# Patient Record
Sex: Female | Born: 2000 | Race: Black or African American | Hispanic: No | Marital: Single | Smoking: Never smoker
Health system: Southern US, Community
[De-identification: ages and names within clinical notes are randomized; demographics above are authoritative.]

## PROBLEM LIST (undated history)

## (undated) DIAGNOSIS — F32A Depression, unspecified: Secondary | ICD-10-CM

## (undated) DIAGNOSIS — T1491XA Suicide attempt, initial encounter: Secondary | ICD-10-CM

## (undated) DIAGNOSIS — F419 Anxiety disorder, unspecified: Secondary | ICD-10-CM

## (undated) DIAGNOSIS — F319 Bipolar disorder, unspecified: Secondary | ICD-10-CM

## (undated) DIAGNOSIS — F209 Schizophrenia, unspecified: Secondary | ICD-10-CM

---
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
## 2023-01-24 IMAGING — DX DG CHEST 1V PORT
1 series · 1 of 1 positions shown · non-contrast
Comparison: None.

CLINICAL DATA: Altered mental status, drug overdose.

EXAM:
PORTABLE CHEST 1 VIEW

[chest]
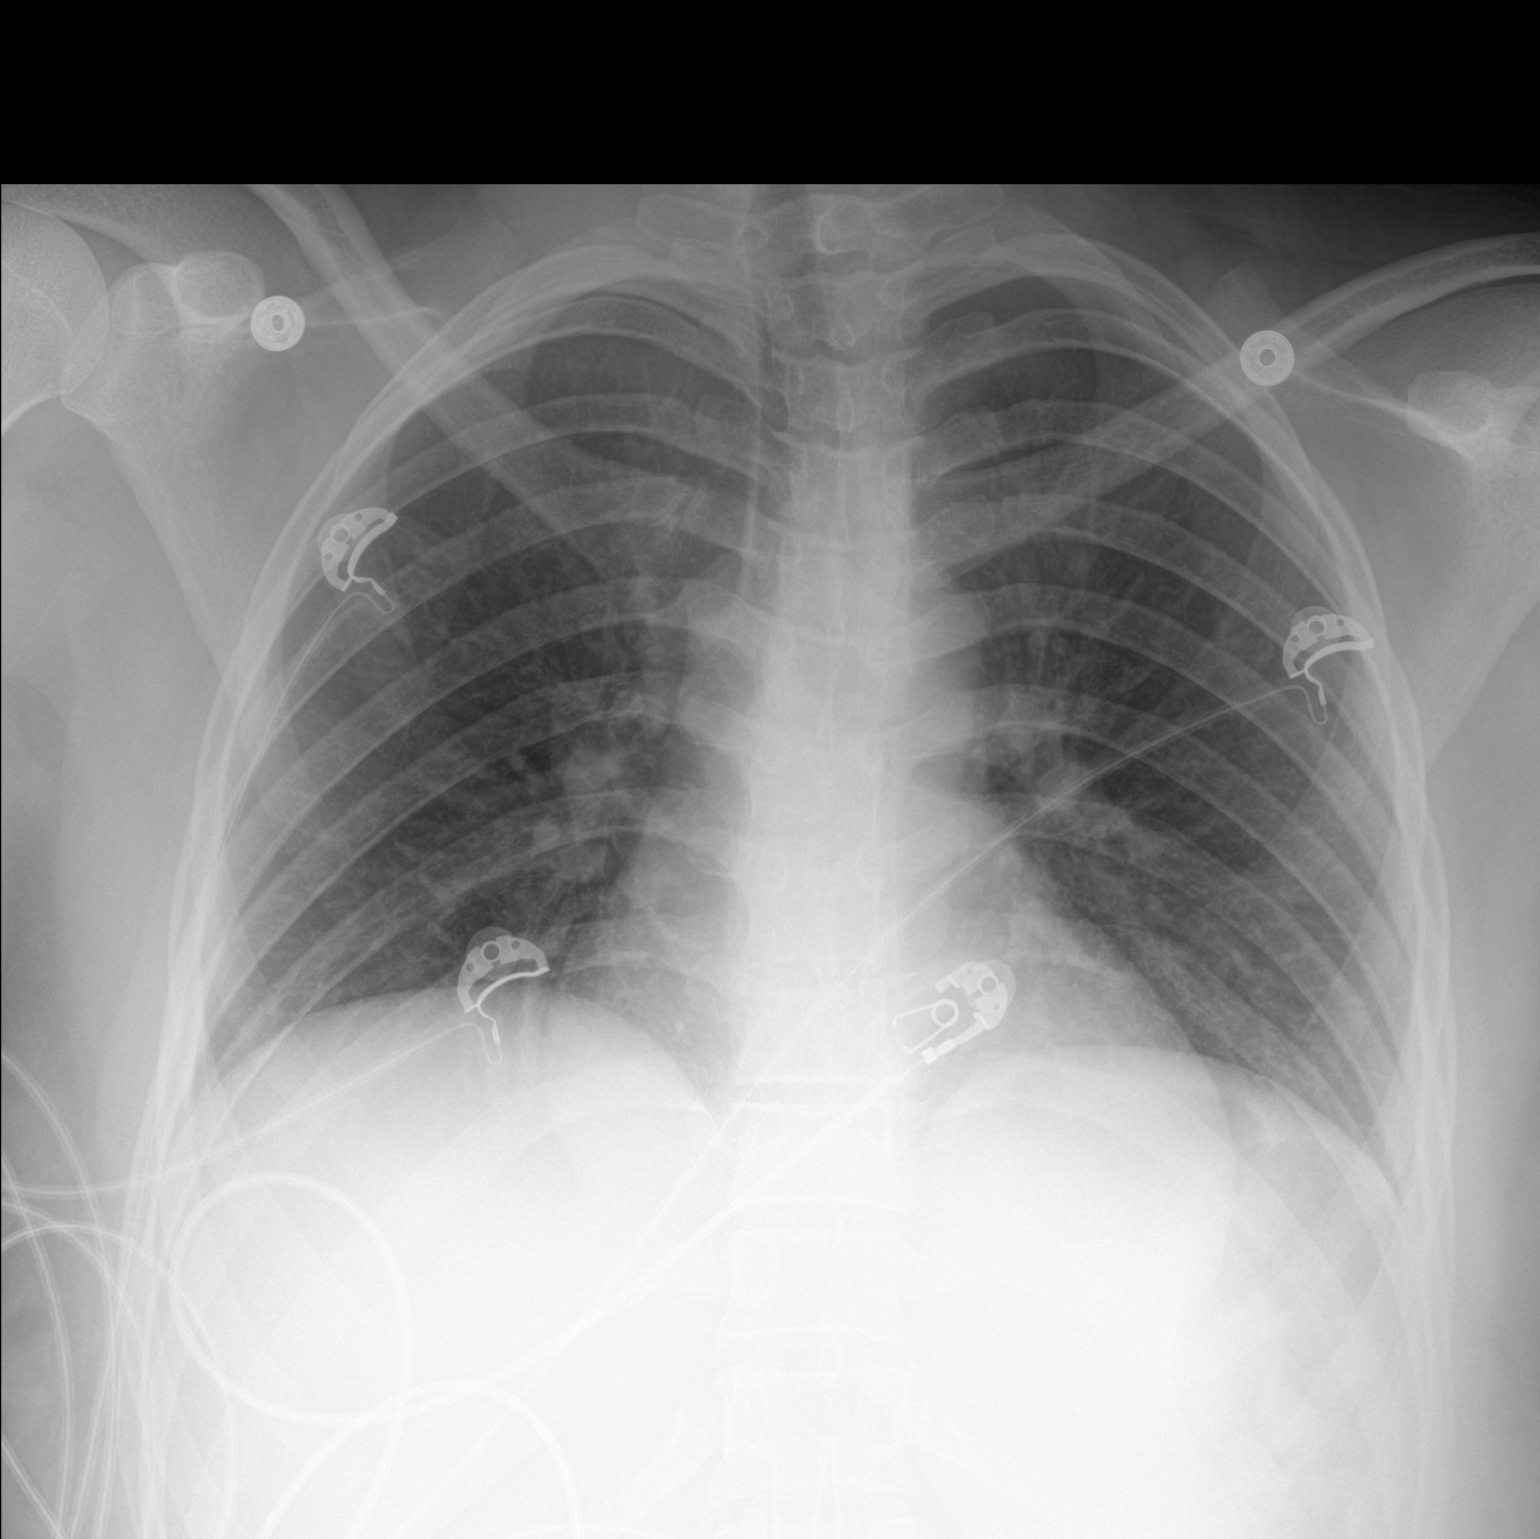

[1 of 1 positions shown; findings below may reference images not displayed]

FINDINGS: The heart size and mediastinal contours are within normal limits.
Both lungs are clear. The visualized skeletal structures are
unremarkable.
IMPRESSION: No active disease.

## 2023-08-05 ENCOUNTER — Ambulatory Visit
Admission: EM | Admit: 2023-08-05 | Discharge: 2023-08-05 | Disposition: A | Discharge status: Home / Self Care | Attending: Family Medicine | Admitting: Family Medicine

## 2023-08-05 ENCOUNTER — Ambulatory Visit (INDEPENDENT_AMBULATORY_CARE_PROVIDER_SITE_OTHER)

## 2023-08-05 DIAGNOSIS — M25561 Pain in right knee: Secondary | ICD-10-CM | POA: Diagnosis not present

## 2023-08-05 MED ORDER — IBUPROFEN 800 MG PO TABS: 800.0000 mg | ORAL_TABLET | Freq: Three times a day (TID) | ORAL | 0 refills | Status: AC | PRN

## 2023-08-05 NOTE — ED Triage Notes (Signed)
"  I have been having some serious recurrent right knee pain, not sure of when the injury might of been, but pain is increasing, ? Swelling". Previous knee injuries due to sports.

## 2023-08-05 NOTE — ED Provider Notes (Signed)
 [facility]    CSN: [id number] Arrival date & time: 08/05/23  1435      History   Chief Complaint Chief Complaint  Patient presents with   Knee Pain    HPI Desiree Perez is a 23 y.o. female.    Knee Pain Here for right knee pain for a couple of months.  At first it was not that bothersome but it is slowly increased in intensity.  Sometimes she feels like the knee is giving way.  It can swell sometimes.  Possibly some recent trauma like a fall.  She has not take any medicines at home  NKDA  Last menstrual cycle was February 18.  She states she does not need a pregnancy test and is not pregnant.  Past Medical History:  Diagnosis Date   Anxiety    Bipolar 1 disorder (HCC)    Depression    Depression    Schizophrenia (HCC)    Suicide attempt [facility])    OD    Patient Active Problem List   Diagnosis Date Noted   Drug overdose, intentional (HCC) 08/09/2021   Schizoaffective disorder, bipolar type (HCC) 06/11/2021   Cannabis abuse 06/11/2021    History reviewed. No pertinent surgical history.  OB History   No obstetric history on file.      Home Medications    Prior to Admission medications   Medication Sig Start Date End Date Taking? Authorizing Provider  amphetamine-dextroamphetamine (ADDERALL XR) 10 MG 24 hr capsule Take 10 mg by mouth every morning. 06/06/23  Yes [provider]  amphetamine-dextroamphetamine (ADDERALL XR) 15 MG 24 hr capsule Take 15 mg by mouth every morning. 03/07/23  Yes [provider]  amphetamine-dextroamphetamine (ADDERALL XR) 20 MG 24 hr capsule Take 20 mg by mouth every morning. 07/26/23  Yes [provider]  escitalopram (LEXAPRO) 20 MG tablet Take 20 mg by mouth daily. 06/24/23  Yes [provider]  ibuprofen (ADVIL) 800 MG tablet Take 1 tablet (800 mg total) by mouth every 8 (eight) hours as needed (pain). 08/05/23  Yes Zenia Resides, MD  lamoTRIgine (LAMICTAL) 100 MG tablet Take  100 mg by mouth daily. 03/14/23  Yes [provider]  lamoTRIgine (LAMICTAL) 150 MG tablet Take 150 mg by mouth daily. 06/24/23  Yes [provider]  penicillin v potassium (VEETID) 500 MG tablet Take 500 mg by mouth 2 (two) times daily. 04/22/23  Yes [provider]  etonogestrel (NEXPLANON) 68 MG IMPL implant 1 each by Subdermal route once.    [provider]  midodrine (PROAMATINE) 5 MG tablet Take 1 tablet (5 mg total) by mouth 3 (three) times daily with meals. 08/13/21   Cathren Harsh, MD    Family History History reviewed. No pertinent family history.  Social History Social History   Tobacco Use   Smoking status: Never   Smokeless tobacco: Never  Vaping Use   Vaping status: Every Day   Substances: Nicotine, THC, CBD, Flavoring  Substance Use Topics   Alcohol use: Yes    Comment: Occassionally.   Drug use: Yes    Types: Marijuana     Allergies   Patient has no known allergies.   Review of Systems Review of Systems   Physical Exam Triage Vital Signs ED Triage Vitals  Encounter Vitals Group     BP 08/05/23 1523 110/73     Systolic BP Percentile --      Diastolic BP Percentile --      Pulse  Rate 08/05/23 1523 65     Resp 08/05/23 1523 16     Temp 08/05/23 1523 97.9 F (36.6 C)     Temp Source 08/05/23 1523 Oral     SpO2 08/05/23 1523 97 %     Weight 08/05/23 1521 208 lb 1.8 oz (94.4 kg)     Height 08/05/23 1521 5\' 10"  (1.778 m)     Head Circumference --      Peak Flow --      Pain Score 08/05/23 1518 7     Pain Loc --      Pain Education --      Exclude from Growth Chart --    No data found.  Updated Vital Signs BP 110/73 (BP Location: Left Arm)   Pulse 65   Temp 97.9 F (36.6 C) (Oral)   Resp 16   Ht 5\' 10"  (1.778 m)   Wt 94.4 kg   LMP 06/28/2023 (Exact Date)   SpO2 97%   BMI 29.86 kg/m   Visual Acuity Right Eye Distance:   Left Eye Distance:   Bilateral Distance:    Right Eye Near:   Left Eye Near:     Bilateral Near:     Physical Exam Vitals reviewed.  Constitutional:      General: She is not in acute distress.    Appearance: She is not ill-appearing, toxic-appearing or diaphoretic.  Musculoskeletal:     Comments: There is some effusion of the right knee.  In the anterior right knee is tender.  Range of motion causes some discomfort.  Neurovascular is intact distally.  There is no edema of the ankle  Skin:    Coloration: Skin is not jaundiced or pale.  Neurological:     Mental Status: She is alert.      UC Treatments / Results  Labs (all labs ordered are listed, but only abnormal results are displayed) Labs Reviewed - No data to display  EKG   Radiology No results found.  Procedures Procedures (including critical care time)  Medications Ordered in UC Medications - No data to display  Initial Impression / Assessment and Plan / UC Course  I have reviewed the triage vital signs and the nursing notes.  Pertinent labs & imaging results that were available during my care of the patient were reviewed by me and considered in my medical decision making (see chart for details).     By my review there are no fractures on the x-ray.  She is advised of radiology overread.  Ibuprofen is sent into the pharmacy and a knee sleeve brace is applied here.  She is given contact information for orthopedics. Final Clinical Impressions(s) / UC Diagnoses   Final diagnoses:  Acute pain of right knee     Discharge Instructions      Your x-ray does not show any broken bones. The radiologist will also read your x-ray, and if their interpretation differs significantly from mine, and the management of your condition would change, we will call you.  Take ibuprofen 800 mg--1 tab every 8 hours as needed for pain.      ED Prescriptions     Medication Sig Dispense Auth. Provider   ibuprofen (ADVIL) 800 MG tablet Take 1 tablet (800 mg total) by mouth every 8 (eight) hours as needed  (pain). 21 tablet Jesus Nevills, Janace Aris, MD      PDMP not reviewed this encounter.   Zenia Resides, MD 08/05/23 [phone]

## 2023-08-05 NOTE — Discharge Instructions (Addendum)
 Your x-ray does not show any broken bones. The radiologist will also read your x-ray, and if their interpretation differs significantly from mine, and the management of your condition would change, we will call you.  Take ibuprofen 800 mg--1 tab every 8 hours as needed for pain.

## 2024-05-20 ENCOUNTER — Encounter: Payer: Self-pay | Admitting: Emergency Medicine

## 2024-05-20 ENCOUNTER — Ambulatory Visit
Admission: EM | Admit: 2024-05-20 | Discharge: 2024-05-20 | Disposition: A | Discharge status: Home / Self Care | Attending: Family Medicine | Admitting: Family Medicine

## 2024-05-20 DIAGNOSIS — J329 Chronic sinusitis, unspecified: Secondary | ICD-10-CM | POA: Diagnosis not present

## 2024-05-20 DIAGNOSIS — J40 Bronchitis, not specified as acute or chronic: Secondary | ICD-10-CM

## 2024-05-20 MED ORDER — PSEUDOEPH-BROMPHEN-DM 30-2-10 MG/5ML PO SYRP: 5.0000 mL | ORAL_SOLUTION | Freq: Three times a day (TID) | ORAL | 0 refills | Status: AC | PRN

## 2024-05-20 MED ORDER — AMOXICILLIN-POT CLAVULANATE 875-125 MG PO TABS: 1.0000 | ORAL_TABLET | Freq: Two times a day (BID) | ORAL | 0 refills | Status: AC

## 2024-05-20 MED ORDER — FLUTICASONE PROPIONATE 50 MCG/ACT NA SUSP: 1.0000 | Freq: Every day | NASAL | 0 refills | Status: AC

## 2024-05-20 NOTE — ED Triage Notes (Signed)
 Patient states that she's been sick since Christmas Eve.  C/o stiff neck, sore throat, cough and congestion has not improved.  Patient was seen at an [facility] Christmas Eve and given medication which did not help.  Patient has taken Promethazine, Cefidinir, Tylenol .

## 2024-05-20 NOTE — ED Provider Notes (Addendum)
 " [facility]    CSN: [id number] Arrival date & time: 05/20/24  1505      History   Chief Complaint Chief Complaint  Patient presents with   Torticollis    HPI Desiree Perez is a 24 y.o. female  presents for evaluation of URI symptoms for 3 weeks. Patient reports associated symptoms of cough, congestion with sinus pressure/pain with postnasal drip causing sore throat. Denies N/V/D, fevers, body aches or shortness of breath. Patient does not have a hx of asthma. Patient is not an active smoker.   Was seen at another urgent care within the first couple of days of her symptoms.  Was started on cefdinir, Promethazine DM without improvement.  Pt has taken painquil OTC for symptoms.  Denies pregnancy or breast-feeding.  Pt has no other concerns at this time.   HPI  Past Medical History:  Diagnosis Date   Anxiety    Bipolar 1 disorder (HCC)    Depression    Depression    Schizophrenia (HCC)    Suicide attempt [facility])    OD    Patient Active Problem List   Diagnosis Date Noted   Drug overdose, intentional (HCC) 08/09/2021   Schizoaffective disorder, bipolar type (HCC) 06/11/2021   Cannabis abuse 06/11/2021    History reviewed. No pertinent surgical history.  OB History   No obstetric history on file.      Home Medications    Prior to Admission medications  Medication Sig Start Date End Date Taking? Authorizing Provider  ABILIFY  10 MG tablet Take 10 mg by mouth daily. 02/23/24  Yes [provider]  amoxicillin -clavulanate (AUGMENTIN ) 875-125 MG tablet Take 1 tablet by mouth every 12 (twelve) hours. 05/20/24  Yes Tiyanna Larcom, Jodi R, NP  amphetamine -dextroamphetamine (ADDERALL XR) 20 MG 24 hr capsule Take 20 mg by mouth every morning. 07/26/23  Yes [provider]  brompheniramine-pseudoephedrine-DM 30-2-10 MG/5ML syrup Take 5 mLs by mouth 3 (three) times daily as needed. 05/20/24  Yes Kaleth Koy, Jodi R, NP  escitalopram  (LEXAPRO ) 20 MG tablet Take 20 mg by  mouth daily. 06/24/23  Yes [provider]  etonogestrel (NEXPLANON) 68 MG IMPL implant 1 each by Subdermal route once.   Yes [provider]  fluticasone  (FLONASE ) 50 MCG/ACT nasal spray Place 1 spray into both nostrils daily. 05/20/24  Yes Mieko Kneebone, Jodi R, NP  hydrOXYzine (ATARAX) 50 MG tablet Take 50 mg by mouth at bedtime. 02/23/24  Yes [provider]  ibuprofen  (ADVIL ) 800 MG tablet Take 1 tablet (800 mg total) by mouth every 8 (eight) hours as needed (pain). 08/05/23  Yes Banister, Pamela K, MD  lamoTRIgine (LAMICTAL) 100 MG tablet Take 100 mg by mouth daily. 03/14/23  Yes [provider]  amphetamine -dextroamphetamine (ADDERALL XR) 10 MG 24 hr capsule Take 10 mg by mouth every morning. 06/06/23   [provider]  amphetamine -dextroamphetamine (ADDERALL XR) 15 MG 24 hr capsule Take 15 mg by mouth every morning. 03/07/23   [provider]  lamoTRIgine (LAMICTAL) 150 MG tablet Take 150 mg by mouth daily. 06/24/23   [provider]  midodrine  (PROAMATINE ) 5 MG tablet Take 1 tablet (5 mg total) by mouth 3 (three) times daily with meals. 08/13/21   Davia Nydia POUR, MD    Family History History reviewed. No pertinent family history.  Social History Social History[1]   Allergies   Patient has no known allergies.   Review of Systems Review of Systems  HENT:  Positive for congestion, postnasal drip,  sinus pressure, sinus pain and sore throat.   Respiratory:  Positive for cough.      Physical Exam Triage Vital Signs ED Triage Vitals  Encounter Vitals Group     BP 05/20/24 1521 109/72     Girls Systolic BP Percentile --      Girls Diastolic BP Percentile --      Boys Systolic BP Percentile --      Boys Diastolic BP Percentile --      Pulse Rate 05/20/24 1521 73     Resp 05/20/24 1521 18     Temp 05/20/24 1521 99.2 F (37.3 C)     Temp Source 05/20/24 1521 Oral     SpO2 05/20/24 1521 96 %     Weight 05/20/24 1518 200 lb  (90.7 kg)     Height 05/20/24 1518 5' 10 (1.778 m)     Head Circumference --      Peak Flow --      Pain Score 05/20/24 1518 8     Pain Loc --      Pain Education --      Exclude from Growth Chart --    No data found.  Updated Vital Signs BP 109/72 (BP Location: Right Arm)   Pulse 73   Temp 99.2 F (37.3 C) (Oral)   Resp 18   Ht 5' 10 (1.778 m)   Wt 200 lb (90.7 kg)   LMP 05/05/2024   SpO2 96%   BMI 28.70 kg/m   Visual Acuity Right Eye Distance:   Left Eye Distance:   Bilateral Distance:    Right Eye Near:   Left Eye Near:    Bilateral Near:     Physical Exam Vitals and nursing note reviewed.  Constitutional:      General: She is not in acute distress.    Appearance: She is well-developed. She is not ill-appearing.  HENT:     Head: Normocephalic and atraumatic.     Right Ear: Tympanic membrane and ear canal normal.     Left Ear: Tympanic membrane and ear canal normal.     Nose: Congestion present.     Right Turbinates: Swollen and pale.     Left Turbinates: Swollen and pale.     Right Sinus: Maxillary sinus tenderness present. No frontal sinus tenderness.     Left Sinus: Maxillary sinus tenderness present. No frontal sinus tenderness.     Mouth/Throat:     Mouth: Mucous membranes are moist.     Pharynx: Oropharynx is clear. Uvula midline. Posterior oropharyngeal erythema present.     Tonsils: No tonsillar exudate or tonsillar abscesses.  Eyes:     Conjunctiva/sclera: Conjunctivae normal.     Pupils: Pupils are equal, round, and reactive to light.  Cardiovascular:     Rate and Rhythm: Normal rate and regular rhythm.     Heart sounds: Normal heart sounds.  Pulmonary:     Effort: Pulmonary effort is normal.     Breath sounds: Normal breath sounds. No wheezing, rhonchi or rales.  Musculoskeletal:     Cervical back: Normal range of motion and neck supple. No rigidity or tenderness.  Lymphadenopathy:     Cervical: No cervical adenopathy.  Skin:    General:  Skin is warm and dry.  Neurological:     General: No focal deficit present.     Mental Status: She is alert and oriented to person, place, and time.  Psychiatric:        Mood and  Affect: Mood normal.        Behavior: Behavior normal.      UC Treatments / Results  Labs (all labs ordered are listed, but only abnormal results are displayed) Labs Reviewed - No data to display  EKG   Radiology No results found.  Procedures Procedures (including critical care time)  Medications Ordered in UC Medications - No data to display  Initial Impression / Assessment and Plan / UC Course  I have reviewed the triage vital signs and the nursing notes.  Pertinent labs & imaging results that were available during my care of the patient were reviewed by me and considered in my medical decision making (see chart for details).     Reviewed exam and symptoms with patient.  Will treat for sinus/bronchitis with Augmentin .  Flonase  daily.  Cough syrup as needed.  Encouraged rest fluids and PCP follow-up 2 to 3 days for recheck.  ER precautions reviewed. Final Clinical Impressions(s) / UC Diagnoses   Final diagnoses:  Sinobronchitis     Discharge Instructions      Start Augmentin  antibiotic twice daily for 7 days.  Use Flonase  daily to help with your sinus pressure/congestion.  May take the cough syrup 3 times a day as needed.  Lots of rest and fluids.  Please follow-up with your PCP in 2 days for recheck.  Please go to the ER for any worsening symptoms.  I hope you feel better soon!    ED Prescriptions     Medication Sig Dispense Auth. Provider   amoxicillin -clavulanate (AUGMENTIN ) 875-125 MG tablet Take 1 tablet by mouth every 12 (twelve) hours. 14 tablet Markie Heffernan, Jodi R, NP   fluticasone  (FLONASE ) 50 MCG/ACT nasal spray Place 1 spray into both nostrils daily. 15.8 mL Shy Guallpa, Jodi R, NP   brompheniramine-pseudoephedrine-DM 30-2-10 MG/5ML syrup Take 5 mLs by mouth 3 (three) times daily as  needed. 120 mL Deliah Strehlow, Jodi R, NP      PDMP not reviewed this encounter.    Loreda Myla SAUNDERS, NP 05/20/24 1532     [1]  Social History Tobacco Use   Smoking status: Never   Smokeless tobacco: Never  Vaping Use   Vaping status: Every Day   Substances: Nicotine , THC, CBD, Flavoring  Substance Use Topics   Alcohol use: Yes    Comment: Occassionally.   Drug use: Yes    Types: Marijuana     Loreda Myla SAUNDERS, NP 05/20/24 1533  "

## 2024-05-20 NOTE — Discharge Instructions (Addendum)
 Start Augmentin  antibiotic twice daily for 7 days.  Use Flonase  daily to help with your sinus pressure/congestion.  May take the cough syrup 3 times a day as needed.  Lots of rest and fluids.  Please follow-up with your PCP in 2 days for recheck.  Please go to the ER for any worsening symptoms.  I hope you feel better soon!
